# Patient Record
Sex: Male | Born: 1975 | Race: White | Hispanic: No | Marital: Single | Smoking: Current every day smoker
Health system: Southern US, Community
[De-identification: ages and names within clinical notes are randomized; demographics above are authoritative.]

## PROBLEM LIST (undated history)

## (undated) DIAGNOSIS — E119 Type 2 diabetes mellitus without complications: Secondary | ICD-10-CM

---

## 2006-09-07 ENCOUNTER — Emergency Department: Payer: Self-pay | Admitting: Emergency Medicine

## 2008-03-02 IMAGING — CR LEFT INDEX FINGER 2+V
1 series · 3 of 3 positions shown · non-contrast
Comparison: none

REASON FOR EXAM: injury
COMMENTS:   LMP: (Male)

[Series 1: view not recorded · 0.17mm/px · 3 of 3 slices shown]
[im 1/3]
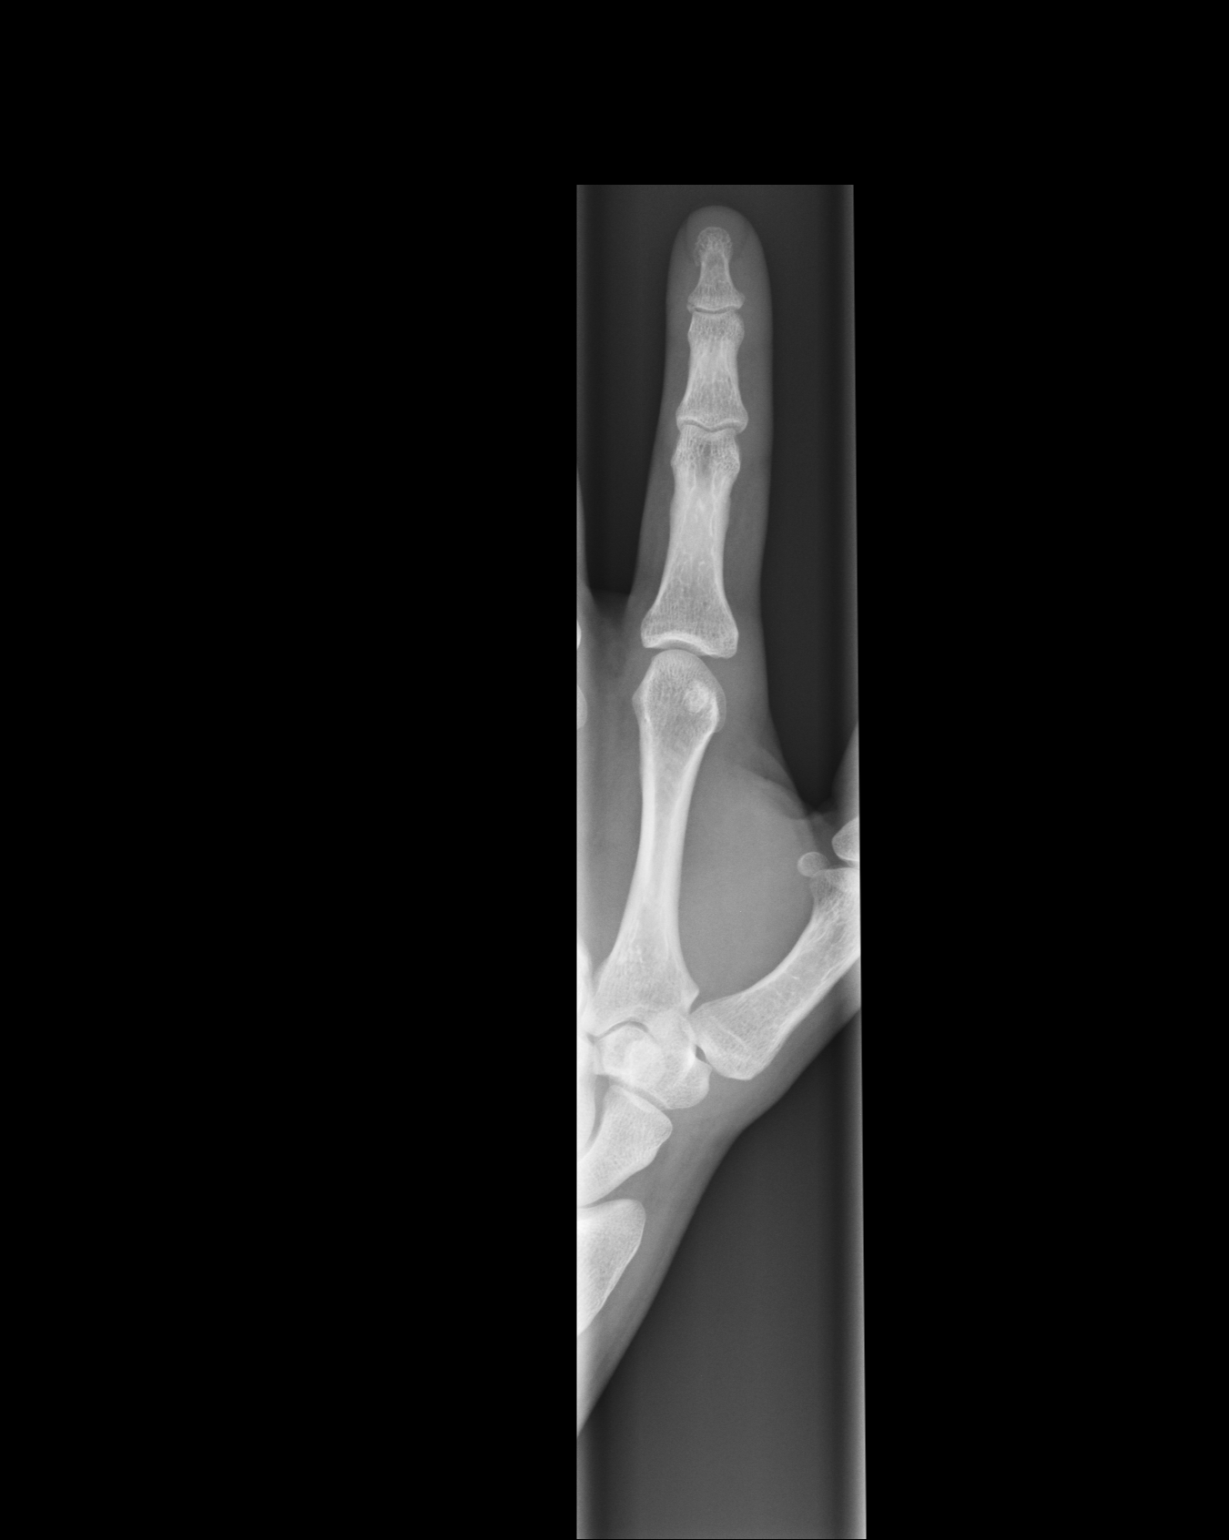
[im 2/3]
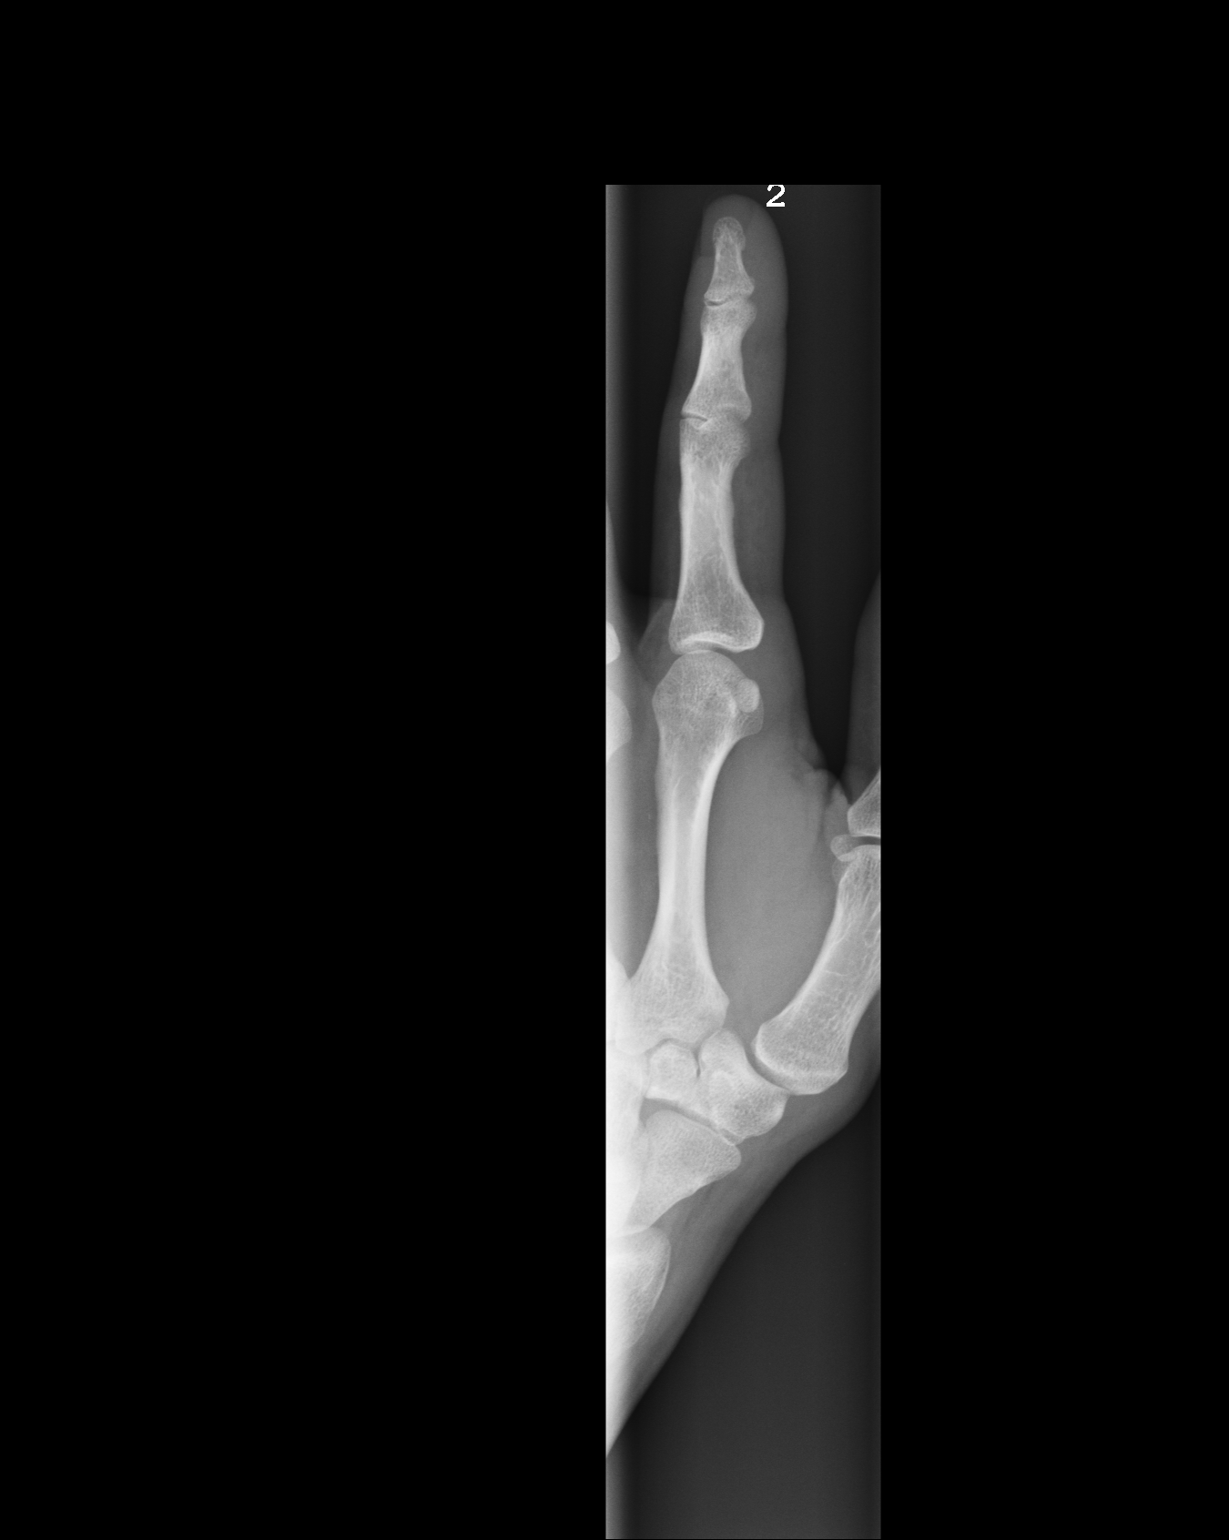
[im 3/3]
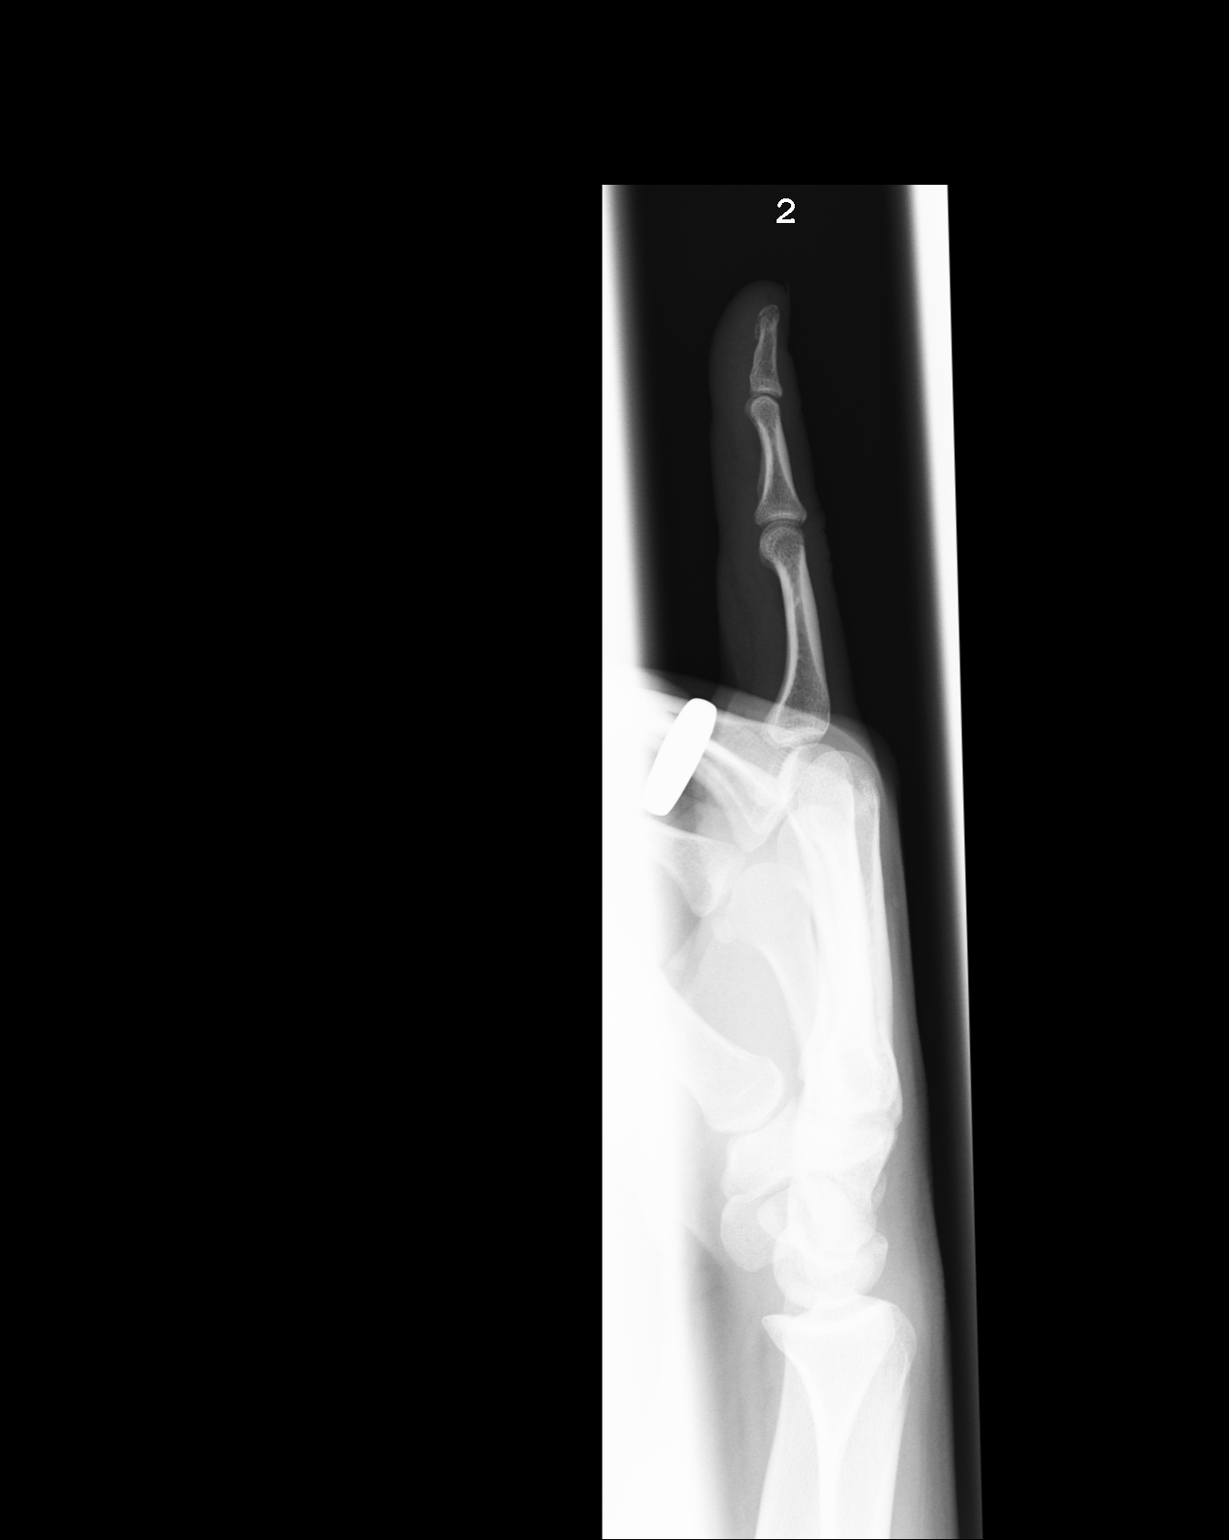

[3 of 3 positions shown; findings below may reference images not displayed]

PROCEDURE:     DXR - DXR FINGER INDEX 2ND DIGIT LT HA  - September 08, 2006 [DATE]

RESULT:     Three views were obtained. In the lateral view there is observed
lucency in the tuft of the distal phalanx. The finding is suspicious for a
non-displaced fracture of the tuft. Correlation with clinical findings is
needed. No other significant osseous abnormalities are seen.
IMPRESSION: 1.     Possible non-displaced fracture of the tuft of the distal phalanx.
Otherwise, normal study.

## 2012-08-04 ENCOUNTER — Emergency Department: Payer: Self-pay | Admitting: Unknown Physician Specialty

## 2015-04-17 ENCOUNTER — Encounter: Payer: Self-pay | Admitting: Medical Oncology

## 2015-04-17 ENCOUNTER — Emergency Department
Admission: EM | Admit: 2015-04-17 | Discharge: 2015-04-17 | Disposition: A | Discharge status: Home / Self Care | Payer: BLUE CROSS/BLUE SHIELD | Attending: Emergency Medicine | Admitting: Emergency Medicine

## 2015-04-17 DIAGNOSIS — Y9289 Other specified places as the place of occurrence of the external cause: Secondary | ICD-10-CM | POA: Insufficient documentation

## 2015-04-17 DIAGNOSIS — S6992XA Unspecified injury of left wrist, hand and finger(s), initial encounter: Secondary | ICD-10-CM | POA: Diagnosis present

## 2015-04-17 DIAGNOSIS — Y9389 Activity, other specified: Secondary | ICD-10-CM | POA: Insufficient documentation

## 2015-04-17 DIAGNOSIS — F17200 Nicotine dependence, unspecified, uncomplicated: Secondary | ICD-10-CM | POA: Insufficient documentation

## 2015-04-17 DIAGNOSIS — M67432 Ganglion, left wrist: Secondary | ICD-10-CM | POA: Diagnosis not present

## 2015-04-17 DIAGNOSIS — Y998 Other external cause status: Secondary | ICD-10-CM | POA: Diagnosis not present

## 2015-04-17 MED ORDER — MELOXICAM 15 MG PO TABS: 15.0000 mg | ORAL_TABLET | Freq: Every day | ORAL | Status: AC

## 2015-04-17 NOTE — ED Notes (Signed)
Left wrist injury 1 week ago

## 2015-04-17 NOTE — ED Provider Notes (Signed)
[facility] Emergency Department Provider Note  ____________________________________________  Time seen: Approximately 11:33 AM  I have reviewed the triage vital signs and the nursing notes.   HISTORY  Chief Complaint Wrist Pain    HPI Dale Walters is a 39 y.o. male who presents emergency department complaining of left wrist pain. He states that he "caught by girlfriend last week" in his arms and felt a popping sensation in his left wrist. He has noticed a bulge in the left wrist accompanied with pain. He states the pain is a pressure/sharp sensation. It waxes and wanes. 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 He denies any other symptoms or complaints.   History reviewed. No pertinent past medical history.  There are no active problems to display for this patient.   History reviewed. No pertinent past surgical history.  Current Outpatient Rx  Name  Route  Sig  Dispense  Refill  . meloxicam (MOBIC) 15 MG tablet   Oral   Take 1 tablet (15 mg total) by mouth daily.   30 tablet   0     Allergies Review of patient's allergies indicates no known allergies.  No family history on file.  Social History Social History  Substance Use Topics  . Smoking status: Current Every Day Smoker  . Smokeless tobacco: None  . Alcohol Use: None    Review of Systems Constitutional: No fever/chills Eyes: No visual changes. ENT: No sore throat. Cardiovascular: Denies chest pain. Respiratory: Denies shortness of breath. Gastrointestinal: No abdominal pain.  No nausea, no vomiting.  No diarrhea.  No constipation. Genitourinary: Negative for dysuria. Musculoskeletal: Negative for back pain. Endorses left wrist pain. Endorses a bulge to left wrist. Skin: Negative for rash. Neurological: Negative for headaches, focal weakness or  numbness.  10-point ROS otherwise negative.  ____________________________________________   PHYSICAL EXAM:  VITAL SIGNS: ED Triage Vitals  Enc Vitals Group     BP --      Pulse --      Resp --      Temp --      Temp src --      SpO2 --      Weight 04/17/15 1115 200 lb (90.719 kg)     Height 04/17/15 1115 6' (1.829 m)     Head Cir --      Peak Flow --      Pain Score 04/17/15 1115 5     Pain Loc --      Pain Edu? --      Excl. in GC? --     Constitutional: Alert and oriented. Well appearing and in no acute distress. Eyes: Conjunctivae are normal. PERRL. EOMI. Head: Atraumatic. Nose: No congestion/rhinnorhea. Mouth/Throat: Mucous membranes are moist.  Oropharynx non-erythematous. Neck: No stridor.   Cardiovascular: Normal rate, regular rhythm. Grossly normal heart sounds.  Good peripheral circulation. Respiratory: Normal respiratory effort.  No retractions. Lungs CTAB. Gastrointestinal: Soft and nontender. No distention. No abdominal bruits. No CVA tenderness. Musculoskeletal: No lower extremity tenderness nor edema.  No joint effusions. Minor edema noted to left wrist when compared with right. Full range of motion to left wrist. There is a palpable nodule to the lateral aspect of wrist. This nodule is located on the lateral aspect of wrist. It is mobile, well-circumscribed. No other tenderness to palpation. Pulses and  sensation are intact. Neurologic:  Normal speech and language. No gross focal neurologic deficits are appreciated. No gait instability. Skin:  Skin is warm, dry and intact. No rash noted. Psychiatric: Mood and affect are normal. Speech and behavior are normal.  ____________________________________________   LABS (all labs ordered are listed, but only abnormal results are displayed)  Labs Reviewed - No data to  display ____________________________________________  EKG   ____________________________________________  RADIOLOGY   ____________________________________________   PROCEDURES  Procedure(s) performed: None  Critical Care performed: No  ____________________________________________   INITIAL IMPRESSION / ASSESSMENT AND PLAN / ED COURSE  Pertinent labs & imaging results that were available during my care of the patient were reviewed by me and considered in my medical decision making (see chart for details).  Patient's diagnosis is consistent with ganglion cyst to left wrist. Patient informed of diagnosis and verbalizes understanding of same. Patient will be placed on anti-inflammatories with instructions to follow-up with orthopedics in 2-3 weeks if symptoms are unresolved. Patient verbalizes understanding of this diagnosis and treatment plan and verbalizes compliance with same.     New Prescriptions   MELOXICAM (MOBIC) 15 MG TABLET    Take 1 tablet (15 mg total) by mouth daily.    ____________________________________________   FINAL CLINICAL IMPRESSION(S) / ED DIAGNOSES  Final diagnoses:  Ganglion cyst of wrist, left      Racheal PatchesJonathan D Cuthriell, PA-C 04/17/15 1138  Governor Rooksebecca Lord, MD 04/17/15 [phone]

## 2015-04-17 NOTE — Discharge Instructions (Signed)
Ganglion Cyst  A ganglion cyst is a noncancerous, fluid-filled lump that occurs near joints or tendons. The ganglion cyst grows out of a joint or the lining of a tendon. It most often develops in the hand or wrist, but it can also develop in the shoulder, elbow, hip, knee, ankle, or foot. The round or oval ganglion cyst can be the size of a pea or larger than a grape. Increased activity may enlarge the size of the cyst because more fluid starts to build up.   CAUSES  It is not known what causes a ganglion cyst to grow. However, it may be related to:  · Inflammation or irritation around the joint.  · An injury.  · Repetitive movements or overuse.  · Arthritis.  RISK FACTORS  Risk factors include:  · Being a woman.  · Being age 20-50.  SIGNS AND SYMPTOMS  Symptoms may include:   · A lump. This most often appears on the hand or wrist, but it can occur in other areas of the body.  · Tingling.  · Pain.  · Numbness.  · Muscle weakness.  · Weak grip.  · Less movement in a joint.  DIAGNOSIS  Ganglion cysts are most often diagnosed based on a physical exam. Your health care provider will feel the lump and may shine a light alongside it. If it is a ganglion cyst, a light often shines through it. Your health care provider may order an X-ray, ultrasound, or MRI to rule out other conditions.  TREATMENT  Ganglion cysts usually go away on their own without treatment. If pain or other symptoms are involved, treatment may be needed. Treatment is also needed if the ganglion cyst limits your movement or if it gets infected. Treatment may include:  · Wearing a brace or splint on your wrist or finger.  · Taking anti-inflammatory medicine.  · Draining fluid from the lump with a needle (aspiration).  · Injecting a steroid into the joint.  · Surgery to remove the ganglion cyst.  HOME CARE INSTRUCTIONS  · Do not press on the ganglion cyst, poke it with a needle, or hit it.  · Take medicines only as directed by your health care  provider.  · Wear your brace or splint as directed by your health care provider.  · Watch your ganglion cyst for any changes.  · Keep all follow-up visits as directed by your health care provider. This is important.  SEEK MEDICAL CARE IF:  · Your ganglion cyst becomes larger or more painful.  · You have increased redness, red streaks, or swelling.  · You have pus coming from the lump.  · You have weakness or numbness in the affected area.  · You have a fever or chills.     This information is not intended to replace advice given to you by your health care provider. Make sure you discuss any questions you have with your health care provider.     Document Released: 04/11/2000 Document Revised: 05/05/2014 Document Reviewed: 09/27/2013  Elsevier Interactive Patient Education ©2016 [employer]

## 2015-11-22 ENCOUNTER — Emergency Department: Payer: BLUE CROSS/BLUE SHIELD

## 2015-11-22 ENCOUNTER — Encounter: Payer: Self-pay | Admitting: Emergency Medicine

## 2015-11-22 ENCOUNTER — Emergency Department
Admission: EM | Admit: 2015-11-22 | Discharge: 2015-11-22 | Disposition: A | Discharge status: Home / Self Care | Payer: BLUE CROSS/BLUE SHIELD | Attending: Student | Admitting: Student

## 2015-11-22 DIAGNOSIS — W228XXA Striking against or struck by other objects, initial encounter: Secondary | ICD-10-CM | POA: Diagnosis not present

## 2015-11-22 DIAGNOSIS — Y999 Unspecified external cause status: Secondary | ICD-10-CM | POA: Insufficient documentation

## 2015-11-22 DIAGNOSIS — Y939 Activity, unspecified: Secondary | ICD-10-CM | POA: Insufficient documentation

## 2015-11-22 DIAGNOSIS — Y929 Unspecified place or not applicable: Secondary | ICD-10-CM | POA: Insufficient documentation

## 2015-11-22 DIAGNOSIS — S6991XA Unspecified injury of right wrist, hand and finger(s), initial encounter: Secondary | ICD-10-CM | POA: Diagnosis present

## 2015-11-22 DIAGNOSIS — S6000XA Contusion of unspecified finger without damage to nail, initial encounter: Secondary | ICD-10-CM

## 2015-11-22 DIAGNOSIS — S60021A Contusion of right index finger without damage to nail, initial encounter: Secondary | ICD-10-CM | POA: Insufficient documentation

## 2015-11-22 DIAGNOSIS — F17210 Nicotine dependence, cigarettes, uncomplicated: Secondary | ICD-10-CM | POA: Diagnosis not present

## 2015-11-22 DIAGNOSIS — Z791 Long term (current) use of non-steroidal anti-inflammatories (NSAID): Secondary | ICD-10-CM | POA: Insufficient documentation

## 2015-11-22 MED ORDER — IBUPROFEN 600 MG PO TABS: 600.0000 mg | ORAL_TABLET | Freq: Three times a day (TID) | ORAL | 0 refills | Status: AC | PRN

## 2015-11-22 MED ORDER — TRAMADOL HCL 50 MG PO TABS: 50.0000 mg | ORAL_TABLET | Freq: Four times a day (QID) | ORAL | 0 refills | Status: AC | PRN

## 2015-11-22 NOTE — ED Notes (Addendum)
See triage note  States his hand slipped and he hit finger on battery  having some pain to right index finger  Min swelling noted   No deformity noted

## 2015-11-22 NOTE — ED Provider Notes (Signed)
[facility] Emergency Department Provider Note   ____________________________________________   First MD Initiated Contact with Patient 11/22/15 [phone]     (approximate)  I have reviewed the triage vital signs and the nursing notes.   HISTORY  Chief Complaint Finger Injury    HPI Dale Walters is a 40 y.o. male patient complaining of pain and edema to the second digit right dominant hand. Patient stated pain was contused between a battery and arrange. Patient denies loss sensation or loss of function of the finger. Patient state he previously fractured same digit 5-7 years ago.Patient describes his pain as "achy". Patient rates his pain as 8/10. No palliative measures taken for this complaint.   History reviewed. No pertinent past medical history.  There are no active problems to display for this patient.   History reviewed. No pertinent surgical history.  Prior to Admission medications   Medication Sig Start Date End Date Taking? Authorizing Provider  ibuprofen (ADVIL,MOTRIN) 600 MG tablet Take 1 tablet (600 mg total) by mouth every 8 (eight) hours as needed. 11/22/15   Joni Reining, PA-C  meloxicam (MOBIC) 15 MG tablet Take 1 tablet (15 mg total) by mouth daily. 04/17/15   Delorise Royals Cuthriell, PA-C  traMADol (ULTRAM) 50 MG tablet Take 1 tablet (50 mg total) by mouth every 6 (six) hours as needed for moderate pain. 11/22/15   Joni Reining, PA-C    Allergies Review of patient's allergies indicates no known allergies.  No family history on file.  Social History Social History  Substance Use Topics  . Smoking status: Current Every Day Smoker    Packs/day: 1.00    Types: Cigarettes  . Smokeless tobacco: Never Used  . Alcohol use Yes     Comment: ocassionally    Review of Systems Constitutional: No fever/chills Eyes: No visual changes. ENT: No sore throat. Cardiovascular: Denies chest pain. Respiratory: Denies shortness of  breath. Gastrointestinal: No abdominal pain.  No nausea, no vomiting.  No diarrhea.  No constipation. Genitourinary: Negative for dysuria. Musculoskeletal: Pain in the second digit right hand. Skin: Negative for rash. Neurological: Negative for headaches, focal weakness or numbness.    ____________________________________________   PHYSICAL EXAM:  VITAL SIGNS: ED Triage Vitals  Enc Vitals Group     BP 11/22/15 0829 (!) 141/91     Pulse Rate 11/22/15 0829 84     Resp 11/22/15 0829 18     Temp 11/22/15 0829 98.1 F (36.7 C)     Temp Source 11/22/15 0829 Oral     SpO2 11/22/15 0829 97 %     Weight 11/22/15 0829 215 lb (97.5 kg)     Height 11/22/15 0829 6' (1.829 m)     Head Circumference --      Peak Flow --      Pain Score 11/22/15 0830 8     Pain Loc --      Pain Edu? --      Excl. in GC? --     Constitutional: Alert and oriented. Well appearing and in no acute distress. Eyes: Conjunctivae are normal. PERRL. EOMI. Head: Atraumatic. Nose: No congestion/rhinnorhea. Mouth/Throat: Mucous membranes are moist.  Oropharynx non-erythematous. Neck: No stridor.  No cervical spine tenderness to palpation. Hematological/Lymphatic/Immunilogical: No cervical lymphadenopathy. Cardiovascular: Normal rate, regular rhythm. Grossly normal heart sounds.  Good peripheral circulation. Respiratory: Normal respiratory effort.  No retractions. Lungs CTAB. Gastrointestinal: Soft and nontender. No distention. No abdominal bruits. No CVA tenderness. Musculoskeletal: No obvious deformity  to the second digit right hand. There is mild edema. Patient has full nuchal range of motion.  Neurologic:  Normal speech and language. No gross focal neurologic deficits are appreciated. No gait instability. Skin:  Skin is warm, dry and intact. No rash noted. Psychiatric: Mood and affect are normal. Speech and behavior are normal.  ____________________________________________   LABS (all labs ordered are  listed, but only abnormal results are displayed)  Labs Reviewed - No data to display ____________________________________________  EKG   ____________________________________________  RADIOLOGY  X-rays negative for fracture. There is a very small foreign body. I, Joni Reining, personally viewed and evaluated these images (plain radiographs) as part of my medical decision making, as well as reviewing the written report by the radiologist.  ____________________________________________   PROCEDURES  Procedure(s) performed: None  Procedures  Critical Care performed: No  ____________________________________________   INITIAL IMPRESSION / ASSESSMENT AND PLAN / ED COURSE  Pertinent labs & imaging results that were available during my care of the patient were reviewed by me and considered in my medical decision making (see chart for details).  Contusion second digit right hand. Chronic foreign body. Discussed x-ray finding with patient. Patient given discharge care instructions. Patient placed in a finger splint. Patient given prescription for ibuprofen and tramadol. Patient advised to follow-up if no improvement or worsening of his complaint.  Clinical Course     ____________________________________________   FINAL CLINICAL IMPRESSION(S) / ED DIAGNOSES  Final diagnoses:  Finger contusion, initial encounter      NEW MEDICATIONS STARTED DURING THIS VISIT:  New Prescriptions   IBUPROFEN (ADVIL,MOTRIN) 600 MG TABLET    Take 1 tablet (600 mg total) by mouth every 8 (eight) hours as needed.   TRAMADOL (ULTRAM) 50 MG TABLET    Take 1 tablet (50 mg total) by mouth every 6 (six) hours as needed for moderate pain.     Note:  This document was prepared using Dragon voice recognition software and may include unintentional dictation errors.    Joni Reining, PA-C 11/22/15 1610    Gayla Doss, MD 11/23/15 [phone]

## 2015-11-22 NOTE — Discharge Instructions (Signed)
Wear splint for 3-5 days as needed. °

## 2015-11-22 NOTE — ED Triage Notes (Signed)
Patient presents to the ED with painful right index finger.  Patient states he accidentally injured his finger with a drill while attempting to change the drill bit.  Patient states he was working for himself at the time.  Patient states, "I have broken this finger before, and since last night it hasn't stopped hurting."  Patient is in no obvious distress at this time.

## 2016-09-19 ENCOUNTER — Emergency Department
Admission: EM | Admit: 2016-09-19 | Discharge: 2016-09-19 | Disposition: A | Discharge status: Home / Self Care | Payer: BLUE CROSS/BLUE SHIELD | Attending: Dermatology | Admitting: Dermatology

## 2016-09-19 ENCOUNTER — Encounter: Payer: Self-pay | Admitting: Intensive Care

## 2016-09-19 DIAGNOSIS — R42 Dizziness and giddiness: Secondary | ICD-10-CM | POA: Diagnosis not present

## 2016-09-19 LAB — CBC
HEMATOCRIT: 45.6 % (ref 40.0–52.0)
HEMOGLOBIN: 15.8 g/dL (ref 13.0–18.0)
MCH: 30.9 pg (ref 26.0–34.0)
MCHC: 34.6 g/dL (ref 32.0–36.0)
MCV: 89.2 fL (ref 80.0–100.0)
Platelets: 224 10*3/uL (ref 150–440)
RBC: 5.11 MIL/uL (ref 4.40–5.90)
RDW: 13.3 % (ref 11.5–14.5)
WBC: 8.6 10*3/uL (ref 3.8–10.6)

## 2016-09-19 LAB — BASIC METABOLIC PANEL
ANION GAP: 7 (ref 5–15)
BUN: 12 mg/dL (ref 6–20)
CALCIUM: 9.3 mg/dL (ref 8.9–10.3)
CHLORIDE: 105 mmol/L (ref 101–111)
CO2: 28 mmol/L (ref 22–32)
Creatinine, Ser: 1.28 mg/dL — ABNORMAL HIGH (ref 0.61–1.24)
GFR calc non Af Amer: >60 mL/min (ref >60)
GLUCOSE: 88 mg/dL (ref 65–99)
POTASSIUM: 4.1 mmol/L (ref 3.5–5.1)
Sodium: 140 mmol/L (ref 135–145)

## 2016-09-19 NOTE — ED Triage Notes (Signed)
Patient presents to ER with c/o cold, dizziness, and states "disoriented" Patient ambulatory in triage with no problems. Denies any injuries/trauma

## 2016-09-23 ENCOUNTER — Telehealth: Payer: Self-pay | Admitting: Emergency Medicine

## 2016-09-23 NOTE — Telephone Encounter (Signed)
Called patient due to lwot to inquire about condition and follow up plans.  No answer and no voicemail  

## 2017-05-16 IMAGING — DX DG FINGER INDEX 2+V*R*
3 series · 3 of 3 positions shown · non-contrast
Comparison: None.

CLINICAL DATA: Injury while changing drill bit

EXAM:
RIGHT SECOND FINGER 2+V

[finger ap]
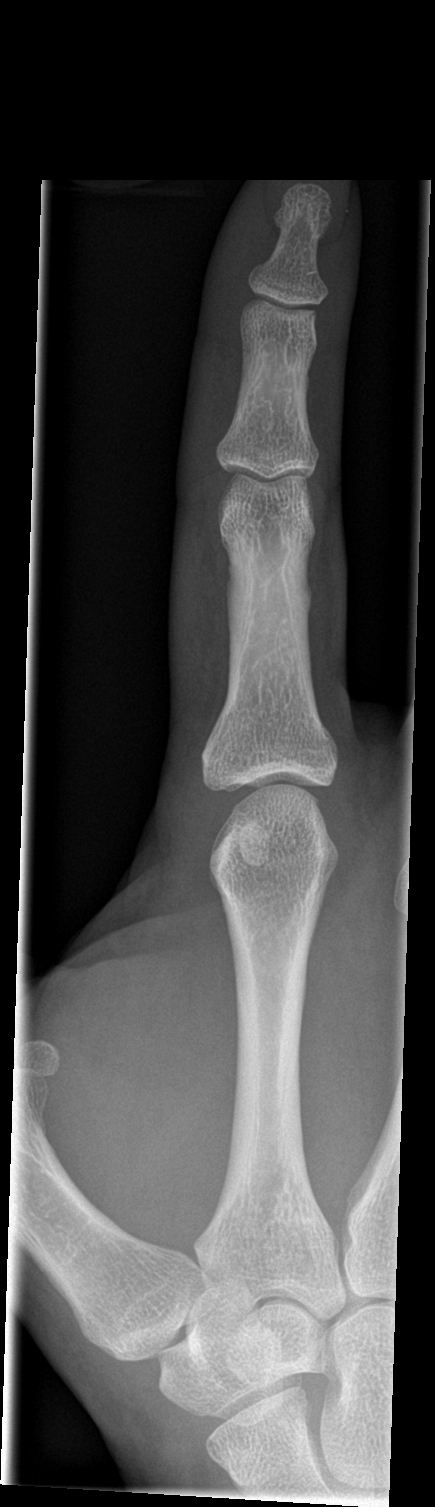

[finger obl]
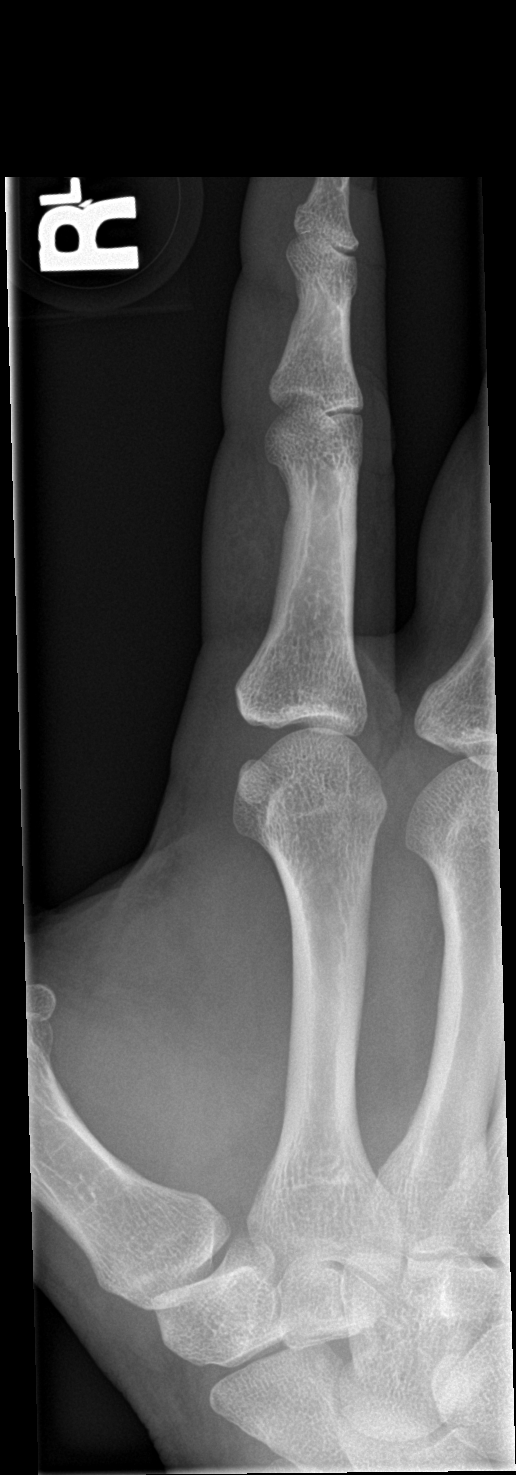

[finger lat]
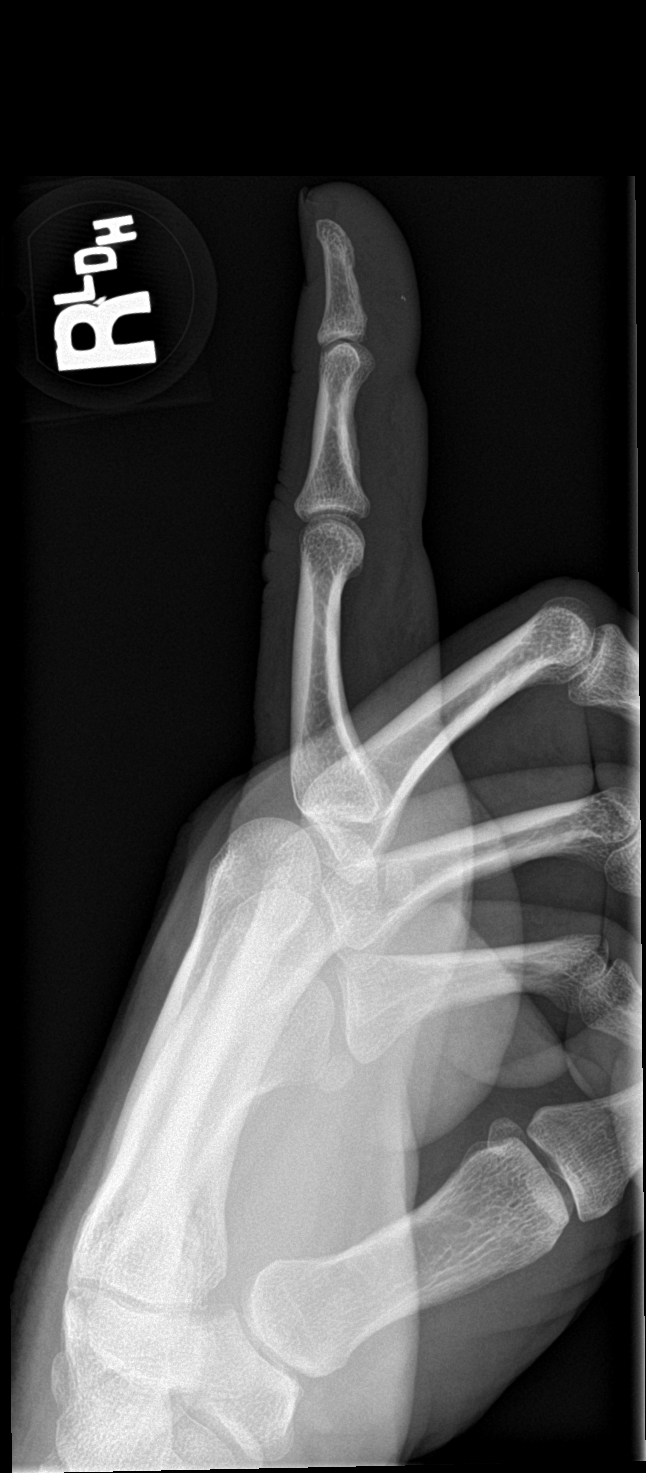

[3 of 3 positions shown; findings below may reference images not displayed]

FINDINGS: Frontal, oblique, and lateral views were obtained it. By there is a
tiny radiopaque foreign body in the soft tissues volar to the
proximal aspect of the first distal phalanx. No fracture or
dislocation. The joint spaces appear normal. No erosive change.
IMPRESSION: Tiny radiopaque foreign body in the soft tissues volar to the distal
phalanx. No fracture or dislocation. No appreciable arthropathic
change.

## 2018-11-09 ENCOUNTER — Other Ambulatory Visit: Payer: Self-pay

## 2018-11-09 ENCOUNTER — Emergency Department
Admission: EM | Admit: 2018-11-09 | Discharge: 2018-11-09 | Disposition: A | Discharge status: Home / Self Care | Payer: BLUE CROSS/BLUE SHIELD | Attending: Emergency Medicine | Admitting: Emergency Medicine

## 2018-11-09 ENCOUNTER — Encounter: Payer: Self-pay | Admitting: *Deleted

## 2018-11-09 DIAGNOSIS — K625 Hemorrhage of anus and rectum: Secondary | ICD-10-CM | POA: Insufficient documentation

## 2018-11-09 DIAGNOSIS — Z5321 Procedure and treatment not carried out due to patient leaving prior to being seen by health care provider: Secondary | ICD-10-CM | POA: Insufficient documentation

## 2018-11-09 LAB — TYPE AND SCREEN
ABO/RH(D): O POS
Antibody Screen: NEGATIVE

## 2018-11-09 LAB — COMPREHENSIVE METABOLIC PANEL
ALT: 19 U/L (ref 0–44)
AST: 21 U/L (ref 15–41)
Albumin: 4.7 g/dL (ref 3.5–5.0)
Alkaline Phosphatase: 50 U/L (ref 38–126)
Anion gap: 9 (ref 5–15)
BUN: 17 mg/dL (ref 6–20)
CO2: 25 mmol/L (ref 22–32)
Calcium: 9.4 mg/dL (ref 8.9–10.3)
Chloride: 106 mmol/L (ref 98–111)
Creatinine, Ser: 1.23 mg/dL (ref 0.61–1.24)
GFR calc Af Amer: >60 mL/min (ref >60)
GFR calc non Af Amer: >60 mL/min (ref >60)
Glucose, Bld: 111 mg/dL — ABNORMAL HIGH (ref 70–99)
Potassium: 3.7 mmol/L (ref 3.5–5.1)
Sodium: 140 mmol/L (ref 135–145)
Total Bilirubin: 1 mg/dL (ref 0.3–1.2)
Total Protein: 7.4 g/dL (ref 6.5–8.1)

## 2018-11-09 LAB — CBC
HCT: 49.6 % (ref 39.0–52.0)
Hemoglobin: 16.6 g/dL (ref 13.0–17.0)
MCH: 30.1 pg (ref 26.0–34.0)
MCHC: 33.5 g/dL (ref 30.0–36.0)
MCV: 89.9 fL (ref 80.0–100.0)
Platelets: 227 10*3/uL (ref 150–400)
RBC: 5.52 MIL/uL (ref 4.22–5.81)
RDW: 12.9 % (ref 11.5–15.5)
WBC: 8.5 10*3/uL (ref 4.0–10.5)
nRBC: 0 % (ref 0.0–0.2)

## 2018-11-09 NOTE — ED Triage Notes (Signed)
Pt to ED reporting loose stool and bright red rectal bleeding x 1 week. Pt reports lower abd pain with nausea but no vomiting. Dizziness started today with intermittent lightheadedness. No fevers at home. Pt denies dark stools.

## 2018-11-10 ENCOUNTER — Telehealth: Payer: Self-pay | Admitting: Emergency Medicine

## 2018-11-10 NOTE — Telephone Encounter (Signed)
Called patient due to lwot to inquire about condition and follow up plans.  Says he is doing okay today.  He does have appt with his doctor.  I told him to let his doctor know that the lab results are complete.

## 2020-02-15 ENCOUNTER — Encounter: Payer: Self-pay | Admitting: Emergency Medicine

## 2020-02-15 ENCOUNTER — Emergency Department
Admission: EM | Admit: 2020-02-15 | Discharge: 2020-02-15 | Disposition: A | Discharge status: Home / Self Care | Payer: Self-pay | Attending: Emergency Medicine | Admitting: Emergency Medicine

## 2020-02-15 ENCOUNTER — Emergency Department: Payer: Self-pay

## 2020-02-15 ENCOUNTER — Other Ambulatory Visit: Payer: Self-pay

## 2020-02-15 DIAGNOSIS — K625 Hemorrhage of anus and rectum: Secondary | ICD-10-CM | POA: Insufficient documentation

## 2020-02-15 DIAGNOSIS — N50811 Right testicular pain: Secondary | ICD-10-CM | POA: Insufficient documentation

## 2020-02-15 DIAGNOSIS — Z5321 Procedure and treatment not carried out due to patient leaving prior to being seen by health care provider: Secondary | ICD-10-CM | POA: Insufficient documentation

## 2020-02-15 LAB — COMPREHENSIVE METABOLIC PANEL
ALT: 49 U/L — ABNORMAL HIGH (ref 0–44)
AST: 31 U/L (ref 15–41)
Albumin: 5 g/dL (ref 3.5–5.0)
Alkaline Phosphatase: 47 U/L (ref 38–126)
Anion gap: 12 (ref 5–15)
BUN: 14 mg/dL (ref 6–20)
CO2: 24 mmol/L (ref 22–32)
Calcium: 9.8 mg/dL (ref 8.9–10.3)
Chloride: 99 mmol/L (ref 98–111)
Creatinine, Ser: 1.36 mg/dL — ABNORMAL HIGH (ref 0.61–1.24)
GFR, Estimated: >60 mL/min (ref >60)
Glucose, Bld: 118 mg/dL — ABNORMAL HIGH (ref 70–99)
Potassium: 3.9 mmol/L (ref 3.5–5.1)
Sodium: 135 mmol/L (ref 135–145)
Total Bilirubin: 0.8 mg/dL (ref 0.3–1.2)
Total Protein: 8 g/dL (ref 6.5–8.1)

## 2020-02-15 LAB — CBC WITH DIFFERENTIAL/PLATELET
Abs Immature Granulocytes: 0.06 10*3/uL (ref 0.00–0.07)
Basophils Absolute: 0.1 10*3/uL (ref 0.0–0.1)
Basophils Relative: 1 %
Eosinophils Absolute: 0.1 10*3/uL (ref 0.0–0.5)
Eosinophils Relative: 1 %
HCT: 50.3 % (ref 39.0–52.0)
Hemoglobin: 17.1 g/dL — ABNORMAL HIGH (ref 13.0–17.0)
Immature Granulocytes: 1 %
Lymphocytes Relative: 15 %
Lymphs Abs: 2 10*3/uL (ref 0.7–4.0)
MCH: 30.2 pg (ref 26.0–34.0)
MCHC: 34 g/dL (ref 30.0–36.0)
MCV: 88.7 fL (ref 80.0–100.0)
Monocytes Absolute: 0.8 10*3/uL (ref 0.1–1.0)
Monocytes Relative: 6 %
Neutro Abs: 10 10*3/uL — ABNORMAL HIGH (ref 1.7–7.7)
Neutrophils Relative %: 76 %
Platelets: 281 10*3/uL (ref 150–400)
RBC: 5.67 MIL/uL (ref 4.22–5.81)
RDW: 12.9 % (ref 11.5–15.5)
WBC: 12.9 10*3/uL — ABNORMAL HIGH (ref 4.0–10.5)
nRBC: 0 % (ref 0.0–0.2)

## 2020-02-15 LAB — URINALYSIS, COMPLETE (UACMP) WITH MICROSCOPIC
Bacteria, UA: NONE SEEN
Bilirubin Urine: NEGATIVE
Glucose, UA: NEGATIVE mg/dL
Hgb urine dipstick: NEGATIVE
Ketones, ur: NEGATIVE mg/dL
Leukocytes,Ua: NEGATIVE
Nitrite: NEGATIVE
Protein, ur: NEGATIVE mg/dL
Specific Gravity, Urine: 1.017 (ref 1.005–1.030)
Squamous Epithelial / HPF: NONE SEEN (ref 0–5)
pH: 5 (ref 5.0–8.0)

## 2020-02-15 LAB — TYPE AND SCREEN
ABO/RH(D): O POS
Antibody Screen: NEGATIVE

## 2020-02-15 LAB — CHLAMYDIA/NGC RT PCR (ARMC ONLY)
Chlamydia Tr: NOT DETECTED
N gonorrhoeae: NOT DETECTED

## 2020-02-15 LAB — LIPASE, BLOOD: Lipase: 39 U/L (ref 11–51)

## 2020-02-15 NOTE — ED Triage Notes (Signed)
Patient ambulatory to triage with steady gait, without difficulty or distress noted; pt reports pain to rt testicle since last Saturday; lower abd pain and rectal bleeding since yesterday; denies any urinary or penile discharge but pt st concerns over STD

## 2020-02-15 NOTE — ED Notes (Signed)
No answer when called from lobby 

## 2020-05-29 ENCOUNTER — Emergency Department
Admission: EM | Admit: 2020-05-29 | Discharge: 2020-05-29 | Disposition: A | Discharge status: Home / Self Care | Payer: Self-pay | Attending: Emergency Medicine | Admitting: Emergency Medicine

## 2020-05-29 ENCOUNTER — Other Ambulatory Visit: Payer: Self-pay

## 2020-05-29 ENCOUNTER — Encounter: Payer: Self-pay | Admitting: Emergency Medicine

## 2020-05-29 DIAGNOSIS — J029 Acute pharyngitis, unspecified: Secondary | ICD-10-CM | POA: Insufficient documentation

## 2020-05-29 DIAGNOSIS — Z5321 Procedure and treatment not carried out due to patient leaving prior to being seen by health care provider: Secondary | ICD-10-CM | POA: Insufficient documentation

## 2020-05-29 NOTE — ED Triage Notes (Signed)
Patient ambulatory to triage with steady gait, without difficulty or distress noted; pt reports awoke with sore throat this morning; here with wife to be seen for same

## 2021-06-12 ENCOUNTER — Other Ambulatory Visit: Payer: Self-pay

## 2021-06-17 ENCOUNTER — Telehealth: Payer: Self-pay

## 2021-06-18 ENCOUNTER — Telehealth: Payer: Self-pay

## 2021-06-18 NOTE — Telephone Encounter (Signed)
Scheduled for appointment in person

## 2021-06-19 NOTE — Telephone Encounter (Signed)
error 

## 2021-09-12 ENCOUNTER — Ambulatory Visit: Payer: Self-pay | Admitting: Gastroenterology

## 2021-09-12 NOTE — Progress Notes (Deleted)
    Gastroenterology Consultation  Referring Provider:     Miki Kins, FNP Primary Care Physician:  Dale Walters, No Pcp Per (Inactive) Primary Gastroenterologist:  Dr. Servando Snare     Reason for Consultation:     Rectal bleeding        HPI:   Dale Walters is a 46 y.o. y/o male referred for consultation & management of rectal bleeding by Dr. Patient, No Pcp Per (Inactive).  This Dale Walters comes in today after being seen by his primary care provider and at that time requesting to have a GI consultation.  The Dale Walters was reporting rectal pain with bright red blood per rectum.  No past medical history on file.  No past surgical history on file.  Prior to Admission medications   Medication Sig Start Date End Date Taking? Authorizing Provider  ibuprofen (ADVIL,MOTRIN) 600 MG tablet Take 1 tablet (600 mg total) by mouth every 8 (eight) hours as needed. 11/22/15   Joni Reining, PA-C  meloxicam (MOBIC) 15 MG tablet Take 1 tablet (15 mg total) by mouth daily. 04/17/15   Cuthriell, Delorise Royals, PA-C  traMADol (ULTRAM) 50 MG tablet Take 1 tablet (50 mg total) by mouth every 6 (six) hours as needed for moderate pain. 11/22/15   Joni Reining, PA-C    No family history on file.   Social History   Tobacco Use   Smoking status: Every Day    Packs/day: 1.00    Types: Cigarettes   Smokeless tobacco: Never  Substance Use Topics   Alcohol use: Yes    Comment: weekends   Drug use: No    Allergies as of 09/12/2021   (No Known Allergies)    Review of Systems:    All systems reviewed and negative except where noted in HPI.   Physical Exam:  There were no vitals taken for this visit. No LMP for male Dale Walters. General:   Alert,  Well-developed, well-nourished, pleasant and cooperative in NAD Head:  Normocephalic and atraumatic. Eyes:  Sclera clear, no icterus.   Conjunctiva pink. Ears:  Normal auditory acuity. Neck:  Supple; no masses or thyromegaly. Lungs:  Respirations even and unlabored.   Clear throughout to auscultation.   No wheezes, crackles, or rhonchi. No acute distress. Heart:  Regular rate and rhythm; no murmurs, clicks, rubs, or gallops. Abdomen:  Normal bowel sounds.  No bruits.  Soft, non-tender and non-distended without masses, hepatosplenomegaly or hernias noted.  No guarding or rebound tenderness.  Negative Carnett sign.   Rectal:  Deferred.  Pulses:  Normal pulses noted. Extremities:  No clubbing or edema.  No cyanosis. Neurologic:  Alert and oriented x3;  grossly normal neurologically. Skin:  Intact without significant lesions or rashes.  No jaundice. Lymph Nodes:  No significant cervical adenopathy. Psych:  Alert and cooperative. Normal mood and affect.  Imaging Studies: No results found.  Assessment and Plan:   Dale Walters is a 46 y.o. y/o male ***    Dale Minium, MD. Dale Walters    Note: This dictation was prepared with Dragon dictation along with smaller phrase technology. Any transcriptional errors that result from this process are unintentional.

## 2023-02-03 ENCOUNTER — Ambulatory Visit: Payer: Self-pay | Admitting: Family Medicine

## 2023-02-03 DIAGNOSIS — Z202 Contact with and (suspected) exposure to infections with a predominantly sexual mode of transmission: Secondary | ICD-10-CM

## 2023-02-03 MED ORDER — METRONIDAZOLE 500 MG PO TABS: 500.0000 mg | ORAL_TABLET | Freq: Two times a day (BID) | ORAL | Status: AC

## 2023-02-03 NOTE — Progress Notes (Signed)
Attestation of [facility]: Evaluation and management procedures were performed by the Registered Nurse under [facility] standing orders.  RN independently saw the patient and provided screenings for them without medical exam. Patient self collected specimens. I have reviewed the RN's note and chart, and I agree with screening ordered.   Ethyn Schetter, FNP  

## 2023-02-03 NOTE — Progress Notes (Addendum)
PT here for std treatment.  States he is a contact to [place]. The patient was dispensed metronidazole today. I provided counseling today regarding the medication. We discussed the medication, the side effects and when to call clinic. Patient given the opportunity to ask questions. Questions answered. Declined additional testing and provider exam. Condoms given. Gaspar Garbe, RN

## 2023-03-17 ENCOUNTER — Encounter: Payer: Self-pay | Admitting: Emergency Medicine

## 2023-03-17 ENCOUNTER — Other Ambulatory Visit: Payer: Self-pay

## 2023-03-17 ENCOUNTER — Emergency Department
Admission: EM | Admit: 2023-03-17 | Discharge: 2023-03-17 | Disposition: A | Discharge status: Home / Self Care | Payer: No Typology Code available for payment source | Attending: Emergency Medicine | Admitting: Emergency Medicine

## 2023-03-17 DIAGNOSIS — R42 Dizziness and giddiness: Secondary | ICD-10-CM | POA: Diagnosis present

## 2023-03-17 DIAGNOSIS — E860 Dehydration: Secondary | ICD-10-CM

## 2023-03-17 DIAGNOSIS — E119 Type 2 diabetes mellitus without complications: Secondary | ICD-10-CM | POA: Diagnosis not present

## 2023-03-17 HISTORY — DX: Type 2 diabetes mellitus without complications: E11.9

## 2023-03-17 LAB — CBC
HCT: 52.4 % — ABNORMAL HIGH (ref 39.0–52.0)
Hemoglobin: 17.9 g/dL — ABNORMAL HIGH (ref 13.0–17.0)
MCH: 30.4 pg (ref 26.0–34.0)
MCHC: 34.2 g/dL (ref 30.0–36.0)
MCV: 89.1 fL (ref 80.0–100.0)
Platelets: 277 10*3/uL (ref 150–400)
RBC: 5.88 MIL/uL — ABNORMAL HIGH (ref 4.22–5.81)
RDW: 13.3 % (ref 11.5–15.5)
WBC: 7.6 10*3/uL (ref 4.0–10.5)
nRBC: 0 % (ref 0.0–0.2)

## 2023-03-17 LAB — BASIC METABOLIC PANEL
Anion gap: 13 (ref 5–15)
BUN: 16 mg/dL (ref 6–20)
CO2: 21 mmol/L — ABNORMAL LOW (ref 22–32)
Calcium: 9.3 mg/dL (ref 8.9–10.3)
Chloride: 106 mmol/L (ref 98–111)
Creatinine, Ser: 1.49 mg/dL — ABNORMAL HIGH (ref 0.61–1.24)
GFR, Estimated: 59 mL/min — ABNORMAL LOW (ref >60)
Glucose, Bld: 139 mg/dL — ABNORMAL HIGH (ref 70–99)
Potassium: 3.8 mmol/L (ref 3.5–5.1)
Sodium: 140 mmol/L (ref 135–145)

## 2023-03-17 LAB — TROPONIN I (HIGH SENSITIVITY): Troponin I (High Sensitivity): 14 ng/L (ref <18)

## 2023-03-17 LAB — HEPATIC FUNCTION PANEL
ALT: 39 U/L (ref 0–44)
AST: 31 U/L (ref 15–41)
Albumin: 4.4 g/dL (ref 3.5–5.0)
Alkaline Phosphatase: 46 U/L (ref 38–126)
Bilirubin, Direct: 0.1 mg/dL (ref 0.0–0.2)
Indirect Bilirubin: 0.9 mg/dL (ref 0.3–0.9)
Total Bilirubin: 1 mg/dL (ref <1.2)
Total Protein: 7.4 g/dL (ref 6.5–8.1)

## 2023-03-17 LAB — MAGNESIUM: Magnesium: 2.3 mg/dL (ref 1.7–2.4)

## 2023-03-17 MED ORDER — MECLIZINE HCL 25 MG PO TABS: 25.0000 mg | ORAL_TABLET | Freq: Three times a day (TID) | ORAL | 0 refills | Status: AC | PRN

## 2023-03-17 MED ORDER — SODIUM CHLORIDE 0.9 % IV BOLUS
1000.0000 mL | Freq: Once | INTRAVENOUS | Status: AC
  Administered 2023-03-17: 1000 mL via INTRAVENOUS

## 2023-03-17 MED ORDER — MECLIZINE HCL 25 MG PO TABS
25.0000 mg | ORAL_TABLET | Freq: Once | ORAL | Status: AC
  Administered 2023-03-17: 25 mg via ORAL
  Filled 2023-03-17: qty 1

## 2023-03-17 NOTE — ED Triage Notes (Signed)
Patient to ED via POV for dizziness. Started yesterday. States having some nausea this AM. Steady gait in triage.

## 2023-03-17 NOTE — ED Provider Notes (Signed)
[facility] Provider Note    Event Date/Time   First MD Initiated Contact with Patient 03/17/23 1404     (approximate)   History   Dizziness   HPI  Dale Walters is a 47 y.o. male with history of type 2 diabetes presents emergency department with dizziness.  Patient states that he did not want to go on metformin so he has been using B12 and magnesium.  Yesterday  had severe dizziness started making him nauseated.  Patient states he continues to feel dizzy.  Feels that he is spinning not the room.  Denies chest pain or shortness of breath.  No vomiting or diarrhea.      Physical Exam   Triage Vital Signs: ED Triage Vitals  Encounter Vitals Group     BP 03/17/23 1109 (!) 153/110     Systolic BP Percentile --      Diastolic BP Percentile --      Pulse Rate 03/17/23 1109 (!) 118     Resp 03/17/23 1109 18     Temp 03/17/23 1109 97.6 F (36.4 C)     Temp Source 03/17/23 1109 Oral     SpO2 03/17/23 1109 95 %     Weight 03/17/23 1109 240 lb (108.9 kg)     Height 03/17/23 1109 6\' 1"  (1.854 m)     Head Circumference --      Peak Flow --      Pain Score 03/17/23 1115 0     Pain Loc --      Pain Education --      Exclude from Growth Chart --     Most recent vital signs: Vitals:   03/17/23 1109  BP: (!) 153/110  Pulse: (!) 118  Resp: 18  Temp: 97.6 F (36.4 C)  SpO2: 95%     General: Awake, no distress.   CV:  Good peripheral perfusion.  Tachycardic, sinus rhythm  resp:  Normal effort. Lungs cta Abd:  No distention.   Other:  Patient becomes dizzy when he goes from lying to sitting.  PERRL, EOMI, grips equal bilaterally   ED Results / Procedures / Treatments   Labs (all labs ordered are listed, but only abnormal results are displayed) Labs Reviewed  BASIC METABOLIC PANEL - Abnormal; Notable for the following components:      Result Value   CO2 21 (*)    Glucose, Bld 139 (*)    Creatinine, Ser 1.49 (*)    GFR, Estimated 59 (*)     All other components within normal limits  CBC - Abnormal; Notable for the following components:   RBC 5.88 (*)    Hemoglobin 17.9 (*)    HCT 52.4 (*)    All other components within normal limits  HEPATIC FUNCTION PANEL  URINALYSIS, ROUTINE W REFLEX MICROSCOPIC  MAGNESIUM  CBG MONITORING, ED  TROPONIN I (HIGH SENSITIVITY)     EKG  EKG Repeat EKG   RADIOLOGY     PROCEDURES:   Procedures   MEDICATIONS ORDERED IN ED: Medications  sodium chloride 0.9 % bolus 1,000 mL (1,000 mLs Intravenous New Bag/Given 03/17/23 1443)  meclizine (ANTIVERT) tablet 25 mg (25 mg Oral Given 03/17/23 1502)     IMPRESSION / MDM / ASSESSMENT AND PLAN / ED COURSE  I reviewed the triage vital signs and the nursing notes.  Differential diagnosis includes, but is not limited to, subdural, SAH, CVA, vertigo, hyper/hypoglycemia, A-fib, tachycardia, dehydration  Patient's presentation is most consistent with acute presentation with potential threat to life or bodily function.   Labs, EKG, Orthostatic vitals  EKG from triage shows sinus tachycardia, repeat EKG shows normal sinus rhythm with prolonged QT, see physician read  Basic metabolic panel shows glucose of 139, creatinine is increased at 1.49 and GFR is decreased at 59 which is minimal.  Hemoglobin is elevated at 17.9 indicating patient may be a little dehydrated.  This could cause dizziness.  Hepatic panel and troponin are both pending.  UA has not been collected yet.  Did discuss this with Dr. Anner Crete.  States give meclizine p.o., fluids.  If improving with this he will not need MRI.  If not improving he will need an MRI due to the dizziness.  Patient was given normal saline 1 L IV   Care being transferred to Paulino Door, PA-C   FINAL CLINICAL IMPRESSION(S) / ED DIAGNOSES   Final diagnoses:  Dizziness     Rx / DC Orders   ED Discharge Orders     None        Note:  This document was  prepared using Dragon voice recognition software and may include unintentional dictation errors.    Faythe Ghee, PA-C 03/17/23 1526    Janith Lima, MD 03/17/23 [phone]

## 2023-03-17 NOTE — ED Provider Notes (Signed)
-----------------------------------------   3:14 PM on 03/17/2023 -----------------------------------------  Blood pressure (!) 153/110, pulse (!) 118, temperature 97.6 F (36.4 C), temperature source Oral, resp. rate 18, height 6\' 1"  (1.854 m), weight 113.4 kg, SpO2 95%.  Assuming care from Dr. Greig Right, PA-C/NP-C.  In short, Dale Walters is a 47 y.o. male with a chief complaint of Dizziness .  Refer to the original H&P for additional details.  The current plan of care is to await fluid bolus and reassess.  ____________________________________________    ED Results / Procedures / Treatments   Labs (all labs ordered are listed, but only abnormal results are displayed) Labs Reviewed  BASIC METABOLIC PANEL - Abnormal; Notable for the following components:      Result Value   CO2 21 (*)    Glucose, Bld 139 (*)    Creatinine, Ser 1.49 (*)    GFR, Estimated 59 (*)    All other components within normal limits  CBC - Abnormal; Notable for the following components:   RBC 5.88 (*)    Hemoglobin 17.9 (*)    HCT 52.4 (*)    All other components within normal limits  HEPATIC FUNCTION PANEL  MAGNESIUM  TROPONIN I (HIGH SENSITIVITY)    EKG    RADIOLOGY  No results found.   PROCEDURES:  Critical Care performed: No  Procedures  Orthostatic VS for the past 24 hrs:  BP- Lying Pulse- Lying BP- Sitting Pulse- Sitting BP- Standing at 0 minutes Pulse- Standing at 0 minutes  03/17/23 1437 (!) 140/105 95 (!) 130/100 97 (!) 121/99 106     MEDICATIONS ORDERED IN ED: Medications  sodium chloride 0.9 % bolus 1,000 mL (0 mLs Intravenous Stopped 03/17/23 1611)  meclizine (ANTIVERT) tablet 25 mg (25 mg Oral Given 03/17/23 1502)     IMPRESSION / MDM / ASSESSMENT AND PLAN / ED COURSE  I reviewed the triage vital signs and the nursing notes.                              Differential diagnosis includes, but is not limited to, electrolyte abnormality or gastroenteritis, vertigo,  dehydration  Patient's presentation is most consistent with acute complicated illness / injury requiring diagnostic workup.  Patient's diagnosis is consistent with dizziness and dehydration.  Patient with reassuring exam and workup at this time.  Patient endorses significant movement of his abdomen after fluid bolus.  No reports of any ongoing vertigo or dizziness.  Patient will be discharged home with prescriptions for meclizine. Patient is to follow up with his primary provider as discussed, as needed or otherwise directed. Patient is given ED precautions to return to the ED for any worsening or new symptoms.     FINAL CLINICAL IMPRESSION(S) / ED DIAGNOSES   Final diagnoses:  Dizziness  Dehydration     Rx / DC Orders   ED Discharge Orders          Ordered    meclizine (ANTIVERT) 25 MG tablet  3 times daily PRN        03/17/23 1606             Note:  This document was prepared using Dragon voice recognition software and may include unintentional dictation errors.    Lissa Hoard, PA-C 03/17/23 2310    Trinna Post, MD 03/18/23 [phone]

## 2023-03-17 NOTE — Discharge Instructions (Signed)
Your exam, labs, and EKG are normal and reassuring at this time.  No signs of a serious underlying cause of your symptoms.  Continue to hydrate with electrolyte water like Gatorade or Powerade.  Drink small meals to prevent lows in your blood sugar.  Follow-up primary provider for ongoing concerns.  Return to the ED if needed.
